# Patient Record
Sex: Female | Born: 1990 | Race: White | Hispanic: No | Marital: Single
Health system: Southern US, Community
[De-identification: ages and names within clinical notes are randomized; demographics above are authoritative.]

## PROBLEM LIST (undated history)

## (undated) DIAGNOSIS — C801 Malignant (primary) neoplasm, unspecified: Secondary | ICD-10-CM

## (undated) HISTORY — PX: CHOLECYSTECTOMY: SHX55

## (undated) HISTORY — PX: MASTECTOMY: SHX3

---

## 2020-04-13 ENCOUNTER — Emergency Department (HOSPITAL_COMMUNITY)
Admission: EM | Admit: 2020-04-13 | Discharge: 2020-04-13 | Disposition: A | Discharge status: Home / Self Care | Payer: Self-pay | Attending: Emergency Medicine | Admitting: Emergency Medicine

## 2020-04-13 ENCOUNTER — Encounter (HOSPITAL_COMMUNITY): Payer: Self-pay

## 2020-04-13 ENCOUNTER — Other Ambulatory Visit: Payer: Self-pay

## 2020-04-13 ENCOUNTER — Emergency Department (HOSPITAL_COMMUNITY): Payer: Self-pay

## 2020-04-13 DIAGNOSIS — Y929 Unspecified place or not applicable: Secondary | ICD-10-CM | POA: Insufficient documentation

## 2020-04-13 DIAGNOSIS — R52 Pain, unspecified: Secondary | ICD-10-CM

## 2020-04-13 DIAGNOSIS — Y999 Unspecified external cause status: Secondary | ICD-10-CM | POA: Insufficient documentation

## 2020-04-13 DIAGNOSIS — Y939 Activity, unspecified: Secondary | ICD-10-CM | POA: Insufficient documentation

## 2020-04-13 DIAGNOSIS — S80811A Abrasion, right lower leg, initial encounter: Secondary | ICD-10-CM | POA: Insufficient documentation

## 2020-04-13 DIAGNOSIS — S99911A Unspecified injury of right ankle, initial encounter: Secondary | ICD-10-CM | POA: Insufficient documentation

## 2020-04-13 DIAGNOSIS — S50812A Abrasion of left forearm, initial encounter: Secondary | ICD-10-CM | POA: Insufficient documentation

## 2020-04-13 DIAGNOSIS — S0081XA Abrasion of other part of head, initial encounter: Secondary | ICD-10-CM | POA: Insufficient documentation

## 2020-04-13 DIAGNOSIS — Z9481 Bone marrow transplant status: Secondary | ICD-10-CM | POA: Insufficient documentation

## 2020-04-13 DIAGNOSIS — Z853 Personal history of malignant neoplasm of breast: Secondary | ICD-10-CM | POA: Insufficient documentation

## 2020-04-13 DIAGNOSIS — S50811A Abrasion of right forearm, initial encounter: Secondary | ICD-10-CM | POA: Insufficient documentation

## 2020-04-13 HISTORY — DX: Malignant (primary) neoplasm, unspecified: C80.1

## 2020-04-13 MED ORDER — DIAZEPAM 5 MG/ML IJ SOLN
5.0000 mg | Freq: Once | INTRAMUSCULAR | Status: AC
  Administered 2020-04-13: 5 mg via INTRAVENOUS
  Filled 2020-04-13: qty 2

## 2020-04-13 MED ORDER — NAPROXEN 500 MG PO TABS
500.0000 mg | ORAL_TABLET | Freq: Two times a day (BID) | ORAL | 0 refills | Status: AC
Start: 2020-04-13 — End: ?

## 2020-04-13 MED ORDER — FENTANYL CITRATE (PF) 100 MCG/2ML IJ SOLN
100.0000 ug | Freq: Once | INTRAMUSCULAR | Status: AC
  Administered 2020-04-13: 100 ug via INTRAVENOUS
  Filled 2020-04-13: qty 2

## 2020-04-13 MED ORDER — OXYCODONE-ACETAMINOPHEN 5-325 MG PO TABS
2.0000 | ORAL_TABLET | Freq: Once | ORAL | Status: AC
  Administered 2020-04-13: 2 via ORAL
  Filled 2020-04-13: qty 2

## 2020-04-13 NOTE — Progress Notes (Signed)
Orthopedic Tech Progress Note Patient Details:  Jackie Mitchell 25-Dec-1990 [id number] Patient was not able to have a splint put on leg due to positioning of the ankle. I was able to move it back into position twice and it quickly went out of place a few seconds after  Ortho Devices Type of Ortho Device: Crutches, Short leg splint   Post Interventions Patient Tolerated: Unable to use device properly   Fong Mccarry E Jadriel Saxer 04/13/2020, 6:28 AM

## 2020-04-13 NOTE — Progress Notes (Signed)
Orthopedic Tech Progress Note Patient Details:  Jackie Mitchell [birth year] [id number]  Ortho Devices Type of Ortho Device: Crutches, Short leg splint Ortho Device/Splint Location: RLE Ortho Device/Splint Interventions: Application   Post Interventions Patient Tolerated: Well, Ambulated well Instructions Provided: Adjustment of device, Poper ambulation with device   [name] [name] 04/13/2020, 6:58 AM
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
## 2020-04-13 NOTE — ED Notes (Signed)
Ortho tech repaged to apply splint after pt refused.

## 2020-04-13 NOTE — Discharge Instructions (Signed)
CT did not show signs of dislocation. We have spoken with our orthopedist-- you will need some office follow-up but no further intervention today. We recommend that you keep splint in place until follow-up. Return here for any new/acute changes.
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
